# Patient Record
Sex: Male | Born: 1995 | Race: White | Hispanic: Yes | Marital: Single
Health system: Midwestern US, Community
[De-identification: ages and names within clinical notes are randomized; demographics above are authoritative.]

---

## 2016-02-27 ENCOUNTER — Emergency Department: Payer: 59

## 2016-02-27 ENCOUNTER — Encounter: Payer: Self-pay | Admitting: Emergency Medicine

## 2016-02-27 ENCOUNTER — Emergency Department
Admission: EM | Admit: 2016-02-27 | Discharge: 2016-02-27 | Disposition: A | Discharge status: Home / Self Care | Payer: 59 | Attending: Emergency Medicine | Admitting: Emergency Medicine

## 2016-02-27 DIAGNOSIS — W009XXA Unspecified fall due to ice and snow, initial encounter: Secondary | ICD-10-CM | POA: Diagnosis not present

## 2016-02-27 DIAGNOSIS — F17200 Nicotine dependence, unspecified, uncomplicated: Secondary | ICD-10-CM | POA: Diagnosis not present

## 2016-02-27 DIAGNOSIS — S99912A Unspecified injury of left ankle, initial encounter: Secondary | ICD-10-CM | POA: Diagnosis present

## 2016-02-27 DIAGNOSIS — Y929 Unspecified place or not applicable: Secondary | ICD-10-CM | POA: Diagnosis not present

## 2016-02-27 DIAGNOSIS — Y9389 Activity, other specified: Secondary | ICD-10-CM | POA: Insufficient documentation

## 2016-02-27 DIAGNOSIS — S82832A Other fracture of upper and lower end of left fibula, initial encounter for closed fracture: Secondary | ICD-10-CM | POA: Insufficient documentation

## 2016-02-27 DIAGNOSIS — Y999 Unspecified external cause status: Secondary | ICD-10-CM | POA: Insufficient documentation

## 2016-02-27 MED ORDER — HYDROCODONE-ACETAMINOPHEN 5-325 MG PO TABS
1.0000 | ORAL_TABLET | Freq: Once | ORAL | Status: AC
  Administered 2016-02-27: 1 via ORAL
  Filled 2016-02-27: qty 1

## 2016-02-27 MED ORDER — HYDROCODONE-ACETAMINOPHEN 5-325 MG PO TABS: 1.0000 | ORAL_TABLET | Freq: Three times a day (TID) | ORAL | 0 refills | Status: AC | PRN

## 2016-02-27 NOTE — ED Triage Notes (Signed)
Pt to ED by POV. Pt fell while running in the snow. Pt fell and states he heard a "loud snap".

## 2016-02-28 NOTE — ED Provider Notes (Signed)
[facility] Emergency Department Provider Note  ____________________________________________  Time seen: Approximately 8:27 AM  I have reviewed the triage vital signs and the nursing notes.   HISTORY  Chief Complaint No chief complaint on file.    HPI Brett NunneryZachary Bowers is a 21 y.o. male presenting to the emergency department with left ankle pain. Patient was playing with 2 classmates in the snow when he fell and "heard a loud snap". Patient did not hit his head or lose consciousness during the fall. Patient states that since fall, his left ankle has been edematous and he has not been able to bear weight. He denies prior traumas or surgeries to the left lower extremity. Patient denies weakness. Ambulates at baseline. 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 Patient's "sore" ankle pain is currently 4 out of 10 in intensity. He has not attempted alleviating measures.   History reviewed. No pertinent past medical history.  There are no active problems to display for this patient.   History reviewed. No pertinent surgical history.  Prior to Admission medications   Medication Sig Start Date End Date Taking? Authorizing Provider  HYDROcodone-acetaminophen (NORCO/VICODIN) 5-325 MG tablet Take 1 tablet by mouth 3 (three) times daily as needed for moderate pain. 02/27/16 03/03/16  Orvil FeilJaclyn M Woods, PA-C    Allergies Penicillins  History reviewed. No pertinent family history.  Social History Social History  Substance Use Topics  . Smoking status: Light Tobacco Smoker  . Smokeless tobacco: Never Used  . Alcohol use Yes     Review of Systems  Eyes: No visual changes.  ENT: No upper respiratory complaints. Cardiovascular: no chest pain. Respiratory: no cough. No SOB. Musculoskeletal: Patient has left ankle pain.  Skin: Patient has ecchymosis and edema of the left ankle. Neurological: Negative for headaches, focal weakness or  numbness.   ____________________________________________   PHYSICAL EXAM:  VITAL SIGNS: ED Triage Vitals  Enc Vitals Group     BP 02/27/16 1255 (!) 146/110     Pulse Rate 02/27/16 1255 92     Resp 02/27/16 1255 16     Temp 02/27/16 1255 97.6 F (36.4 C)     Temp Source 02/27/16 1255 Oral     SpO2 02/27/16 1255 100 %     Weight 02/27/16 1256 145 lb (65.8 kg)     Height 02/27/16 1256 5\' 11"  (1.803 m)     Head Circumference --      Peak Flow --      Pain Score 02/27/16 1309 7     Pain Loc --      Pain Edu? --      Excl. in [place]? --      Constitutional: Alert and oriented. Well appearing and in no acute distress.She jokes and laughs throughout exam. Cardiovascular: Normal rate, regular rhythm. Normal S1 and S2.  Good peripheral circulation. Respiratory: Normal respiratory effort without tachypnea or retractions. Lungs CTAB. Good air entry to the bases with no decreased or absent breath sounds. Musculoskeletal: To inspection, left ankle is edematous with focal ecchymosis of the skin overlying the lateral malleolus. Patient has 5 out of 5 strength in the lower extremities bilaterally. Patient is able to perform dorsiflexion and plantar flexion at the left ankle. Range of motion is moderately compromised secondary to pain. Patient is able to actively flex and extend all 5 left toes. Palpable dorsalis pedis pulse bilaterally and symmetrically. No pain was elicited with palpation of the fibular head. Patient has pain with palpation over the  lateral malleolus. Neurologic:  Normal speech and language. No gross focal neurologic deficits are appreciated. Reflexes are 2+ and symmetric in the lower extremities bilaterally. Psychiatric: Mood and affect are normal. Speech and behavior are normal. Patient exhibits appropriate insight and judgement.  ____________________________________________   LABS (all labs ordered are listed, but only abnormal results are displayed)  Labs Reviewed - No data  to display ____________________________________________  EKG   ____________________________________________  RADIOLOGY Geraldo Pitter, personally viewed and evaluated these images (plain radiographs) as part of my medical decision making, as well as reviewing the written report by the radiologist.  Dg Ankle Complete Left  Result Date: 02/27/2016 CLINICAL DATA:  Fall, pain and swelling EXAM: LEFT ANKLE COMPLETE - 3+ VIEW COMPARISON:  None. FINDINGS: Slightly displaced obliquely oriented fracture within the distal left fibula. Associated soft tissue swelling/edema over the distal fibula and lateral malleolus. Distal tibia appears intact and normally aligned. Ankle mortise is symmetric and talar dome appears intact. Visualized portions of the hindfoot and midfoot appear intact and normally aligned. IMPRESSION: Slightly displaced fracture of the distal left fibula. Associated soft tissue swelling/edema. Electronically Signed   By: Bary Richard M.D.   On: 02/27/2016 14:11    ____________________________________________    PROCEDURES  Procedure(s) performed:    Procedures    Medications  HYDROcodone-acetaminophen (NORCO/VICODIN) 5-325 MG per tablet 1 tablet (1 tablet Oral Given 02/27/16 1442)     ____________________________________________   INITIAL IMPRESSION / ASSESSMENT AND PLAN / ED COURSE  Pertinent labs & imaging results that were available during my care of the patient were reviewed by me and considered in my medical decision making (see chart for details).  Review of the [place] CSRS was performed in accordance of the [facility] prior to dispensing any controlled drugs.    Assessment and plan:  Distal fibula fracture:  Patient presents to the emergency department with left ankle pain. DG left ankle reveals a mildly displaced distal fibula fracture. Patient was placed in a stirrup splint and a referral was made to orthopedics, Dr. Hyacinth Meeker. Patient was advised to make an  appointment as soon as possible. Patient was discharged with Norco for pain. All patient questions were answered. ____________________________________________  FINAL CLINICAL IMPRESSION(S) / ED DIAGNOSES  Final diagnoses:  Other closed fracture of distal end of left fibula, initial encounter      NEW MEDICATIONS STARTED DURING THIS VISIT:  There are no discharge medications for this patient.       This chart was dictated using voice recognition software/Dragon. Despite best efforts to proofread, errors can occur which can change the meaning. Any change was purely unintentional.    Orvil Feil, PA-C 02/28/16 0840    Emily Filbert, MD 02/28/16 [phone]

## 2018-02-27 ENCOUNTER — Emergency Department
Admission: EM | Admit: 2018-02-27 | Discharge: 2018-02-27 | Disposition: A | Discharge status: Home / Self Care | Payer: 59 | Attending: Emergency Medicine | Admitting: Emergency Medicine

## 2018-02-27 ENCOUNTER — Other Ambulatory Visit: Payer: Self-pay

## 2018-02-27 ENCOUNTER — Emergency Department: Payer: 59

## 2018-02-27 ENCOUNTER — Encounter: Payer: Self-pay | Admitting: Radiology

## 2018-02-27 DIAGNOSIS — R109 Unspecified abdominal pain: Secondary | ICD-10-CM | POA: Diagnosis present

## 2018-02-27 DIAGNOSIS — Z79899 Other long term (current) drug therapy: Secondary | ICD-10-CM | POA: Diagnosis not present

## 2018-02-27 DIAGNOSIS — F17200 Nicotine dependence, unspecified, uncomplicated: Secondary | ICD-10-CM | POA: Insufficient documentation

## 2018-02-27 DIAGNOSIS — R1031 Right lower quadrant pain: Secondary | ICD-10-CM

## 2018-02-27 LAB — COMPREHENSIVE METABOLIC PANEL
ALBUMIN: 5.3 g/dL — AB (ref 3.5–5.0)
ALK PHOS: 63 U/L (ref 38–126)
ALT: 15 U/L (ref 0–44)
AST: 18 U/L (ref 15–41)
Anion gap: 8 (ref 5–15)
BILIRUBIN TOTAL: 1.5 mg/dL — AB (ref 0.3–1.2)
BUN: 14 mg/dL (ref 6–20)
CALCIUM: 9.5 mg/dL (ref 8.9–10.3)
CO2: 27 mmol/L (ref 22–32)
Chloride: 104 mmol/L (ref 98–111)
Creatinine, Ser: 1 mg/dL (ref 0.61–1.24)
GFR calc Af Amer: >60 mL/min (ref >60)
GFR calc non Af Amer: >60 mL/min (ref >60)
GLUCOSE: 119 mg/dL — AB (ref 70–99)
POTASSIUM: 3.5 mmol/L (ref 3.5–5.1)
SODIUM: 139 mmol/L (ref 135–145)
TOTAL PROTEIN: 7.9 g/dL (ref 6.5–8.1)

## 2018-02-27 LAB — CBC
HCT: 45.9 % (ref 39.0–52.0)
HEMOGLOBIN: 16 g/dL (ref 13.0–17.0)
MCH: 32.9 pg (ref 26.0–34.0)
MCHC: 34.9 g/dL (ref 30.0–36.0)
MCV: 94.4 fL (ref 80.0–100.0)
Platelets: 236 10*3/uL (ref 150–400)
RBC: 4.86 MIL/uL (ref 4.22–5.81)
RDW: 12.2 % (ref 11.5–15.5)
WBC: 5.5 10*3/uL (ref 4.0–10.5)
nRBC: 0 % (ref 0.0–0.2)

## 2018-02-27 LAB — LIPASE, BLOOD: Lipase: 27 U/L (ref 11–51)

## 2018-02-27 MED ORDER — IOHEXOL 300 MG/ML  SOLN
100.0000 mL | Freq: Once | INTRAMUSCULAR | Status: AC | PRN
  Administered 2018-02-27: 100 mL via INTRAVENOUS

## 2018-02-27 MED ORDER — IOPAMIDOL (ISOVUE-300) INJECTION 61%
30.0000 mL | Freq: Once | INTRAVENOUS | Status: AC | PRN
  Administered 2018-02-27: 30 mL via ORAL

## 2018-02-27 NOTE — ED Provider Notes (Signed)
[facility] Emergency Department Provider Note   ____________________________________________    I have reviewed the triage vital signs and the nursing notes.   HISTORY  Chief Complaint Abdominal Pain     HPI Brett Bowers is a 23 y.o. male who presents with complaints of abdominal pain.  Patient reports 3 days ago developed pain just to the right of his umbilicus, describes it as a dull ache.  Over the last 24 hours it is shifted into his right lower quadrant and become more sharp in nature.  He went to urgent care prior to arrival who referred him to the ED for concern for appendicitis.  Denies fevers or chills, reports the pain is mild to moderate at worst.  Has not taken anything for this.  No history of abdominal surgery.  Does have a history of kidney stones but many years ago.  History reviewed. No pertinent past medical history.  There are no active problems to display for this patient.   No past surgical history on file.  Prior to Admission medications   Medication Sig Start Date End Date Taking? Authorizing Provider  amphetamine-dextroamphetamine (ADDERALL XR) 10 MG 24 hr capsule Take 10 mg by mouth daily.   Yes [provider]     Allergies Penicillins  No family history on file.  Social History Social History   Tobacco Use  . Smoking status: Light Tobacco Smoker  . Smokeless tobacco: Never Used  Substance Use Topics  . Alcohol use: Yes  . Drug use: Yes    Types: Marijuana    Review of Systems  Constitutional: No fever/chills Eyes: No visual changes.  ENT: No sore throat. Cardiovascular: Denies chest pain. Respiratory: Denies shortness of breath. Gastrointestinal: As above Genitourinary: Negative for dysuria.  No hematuria Musculoskeletal: Negative for back pain. Skin: Negative for rash. Neurological: Negative for headaches or weakness   ____________________________________________   PHYSICAL  EXAM:  VITAL SIGNS: ED Triage Vitals [02/27/18 1701]  Enc Vitals Group     BP 133/80     Pulse Rate 75     Resp      Temp 98.4 F (36.9 C)     Temp Source Oral     SpO2 100 %     Weight 68.5 kg (151 lb)     Height 1.803 m (5\' 11" )     Head Circumference      Peak Flow      Pain Score      Pain Loc      Pain Edu?      Excl. in [place]?     Constitutional: Alert and oriented.  Eyes: Conjunctivae are normal.   Nose: No congestion/rhinnorhea. Mouth/Throat: Mucous membranes are moist.    Cardiovascular: Normal rate, regular rhythm. Grossly normal heart sounds.  Good peripheral circulation. Respiratory: Normal respiratory effort.  No retractions. Lungs CTAB. GI: Minimal tenderness at McBurney's point, nonsurgical abdomen, no distention Musculoskeletal:  Warm and well perfused Neurologic:  Normal speech and language. No gross focal neurologic deficits are appreciated.  Skin:  Skin is warm, dry and intact. No rash noted. Psychiatric: Mood and affect are normal. Speech and behavior are normal.  ____________________________________________   LABS (all labs ordered are listed, but only abnormal results are displayed)  Labs Reviewed  COMPREHENSIVE METABOLIC PANEL - Abnormal; Notable for the following components:      Result Value   Glucose, Bld 119 (*)    Albumin 5.3 (*)    Total Bilirubin 1.5 (*)  All other components within normal limits  LIPASE, BLOOD  CBC  URINALYSIS, COMPLETE (UACMP) WITH MICROSCOPIC   ____________________________________________  EKG  None ____________________________________________  RADIOLOGY  CT abdomen pelvis ____________________________________________   PROCEDURES  Procedure(s) performed: No  Procedures   Critical Care performed: No ____________________________________________   INITIAL IMPRESSION / ASSESSMENT AND PLAN / ED COURSE  Pertinent labs & imaging results that were available during my care of the patient were  reviewed by me and considered in my medical decision making (see chart for details).  Patient presents with right lower quadrant abdominal pain.  Minimal tenderness on exam however history is concerning for appendicitis.  Lab work is unremarkable, we will obtain CT abdomen pelvis reevaluate  ----------------------------------------- 7:31 PM on 02/27/2018 -----------------------------------------  CT abdomen pelvis is negative for appendicitis, no abnormalities noted.  Appropriate for discharge at this time with strict return precautions of any worsening pain.  Outpatient follow-up with PCP.    ____________________________________________   FINAL CLINICAL IMPRESSION(S) / ED DIAGNOSES  Final diagnoses:  Right lower quadrant abdominal pain        Note:  This document was prepared using Dragon voice recognition software and may include unintentional dictation errors.   Jene Every, MD 02/27/18 [phone]

## 2018-02-27 NOTE — ED Triage Notes (Signed)
Pt arrived via POV from urgent care, pain to RLQ started 3 days ago, has moved from umbilical area to the RLQ.    Pt denies N/V or fevers, pt states he has had loss of appetite.  Pt reports eating chick-fil-a just prior to arrival.

## 2018-02-27 NOTE — ED Provider Notes (Signed)
Formatting of this note is different from the original.  Images from the original note were not included.      [facility]  Emergency Department Provider Note    ____________________________________________    I have reviewed the triage vital signs and the nursing notes.    HISTORY    Chief Complaint  Abdominal Pain    HPI  Kenneth Guzman is a 23 y.o. male who presents with complaints of abdominal pain.  Patient reports 3 days ago developed pain just to the right of his umbilicus, describes it as a dull ache.  Over the last 24 hours it is shifted into his right lower quadrant and become more sharp in nature.  He went to urgent care prior to arrival who referred him to the ED for concern for appendicitis.  Denies fevers or chills, reports the pain is mild to moderate at worst.  Has not taken anything for this.  No history of abdominal surgery.  Does have a history of kidney stones but many years ago.    History reviewed. No pertinent past medical history.    There are no active problems to display for this patient.    No past surgical history on file.    Prior to Admission medications    Medication Sig Start Date End Date Taking? Authorizing Provider   amphetamine-dextroamphetamine (ADDERALL XR) 10 MG 24 hr capsule Take 10 mg by mouth daily.   Yes [provider]       Allergies  Penicillins    No family history on file.    Social History  Social History     Tobacco Use   ? Smoking status: Light Tobacco Smoker   ? Smokeless tobacco: Never Used   Substance Use Topics   ? Alcohol use: Yes   ? Drug use: Yes     Types: Marijuana     Review of Systems    Constitutional: No fever/chills  Eyes: No visual changes.   ENT: No sore throat.  Cardiovascular: Denies chest pain.  Respiratory: Denies shortness of breath.  Gastrointestinal: As above  Genitourinary: Negative for dysuria.  No hematuria  Musculoskeletal: Negative for back pain.  Skin: Negative for rash.  Neurological: Negative for headaches  or weakness    ____________________________________________    PHYSICAL EXAM:    VITAL SIGNS:  ED Triage Vitals [02/27/18 1701]   Enc Vitals Group      BP 133/80      Pulse Rate 75      Resp       Temp 98.4 F (36.9 C)      Temp Source Oral      SpO2 100 %      Weight 68.5 kg (151 lb)      Height 1.803 m (5\' 11" )      Head Circumference       Peak Flow       Pain Score       Pain Loc       Pain Edu?       Excl. in [place]?      Constitutional: Alert and oriented.   Eyes: Conjunctivae are normal.     Nose: No congestion/rhinnorhea.  Mouth/Throat: Mucous membranes are moist.      Cardiovascular: Normal rate, regular rhythm. Grossly normal heart sounds.  Good peripheral circulation.  Respiratory: Normal respiratory effort.  No retractions. Lungs CTAB.  GI: Minimal tenderness at McBurney's point, nonsurgical abdomen, no distention  Musculoskeletal:  Warm  and well perfused  Neurologic:  Normal speech and language. No gross focal neurologic deficits are appreciated.   Skin:  Skin is warm, dry and intact. No rash noted.  Psychiatric: Mood and affect are normal. Speech and behavior are normal.    ____________________________________________    LABS  (all labs ordered are listed, but only abnormal results are displayed)    Labs Reviewed   COMPREHENSIVE METABOLIC PANEL - Abnormal; Notable for the following components:       Result Value    Glucose, Bld 119 (*)     Albumin 5.3 (*)     Total Bilirubin 1.5 (*)     All other components within normal limits   LIPASE, BLOOD   CBC   URINALYSIS, COMPLETE (UACMP) WITH MICROSCOPIC     ____________________________________________    EKG    None  ____________________________________________    RADIOLOGY    CT abdomen pelvis  ____________________________________________    PROCEDURES    Procedure(s) performed: No    Procedures    Critical Care performed: No  ____________________________________________    INITIAL IMPRESSION / ASSESSMENT AND PLAN / ED COURSE    Pertinent labs & imaging results  that were available during my care of the patient were reviewed by me and considered in my medical decision making (see chart for details).    Patient presents with right lower quadrant abdominal pain.  Minimal tenderness on exam however history is concerning for appendicitis.  Lab work is unremarkable, we will obtain CT abdomen pelvis reevaluate    -----------------------------------------  7:31 PM on 02/27/2018  -----------------------------------------    CT abdomen pelvis is negative for appendicitis, no abnormalities noted.  Appropriate for discharge at this time with strict return precautions of any worsening pain.  Outpatient follow-up with PCP.      ____________________________________________    FINAL CLINICAL IMPRESSION(S) / ED DIAGNOSES    Final diagnoses:   Right lower quadrant abdominal pain     Note:  This document was prepared using Dragon voice recognition software and may include unintentional dictation errors.    Lavonia Drafts, MD  02/27/18 1932    Electronically signed by Lavonia Drafts, MD at 02/27/2018  7:32 PM EST

## 2018-02-27 NOTE — ED Triage Notes (Signed)
Formatting of this note might be different from the original.  Pt arrived via POV from urgent care, pain to RLQ started 3 days ago, has moved from umbilical area to the RLQ.      Pt denies N/V or fevers, pt states he has had loss of appetite.    Pt reports eating chick-fil-a just prior to arrival.   Electronically signed by Ulyses Amor, RN at 02/27/2018  5:05 PM EST

## 2018-12-30 ENCOUNTER — Emergency Department: Admit: 2018-12-30 | Payer: PRIVATE HEALTH INSURANCE

## 2018-12-30 ENCOUNTER — Inpatient Hospital Stay
Admit: 2018-12-30 | Discharge: 2018-12-30 | Disposition: A | Discharge status: Home / Self Care | Payer: PRIVATE HEALTH INSURANCE | Attending: Student in an Organized Health Care Education/Training Program

## 2018-12-30 DIAGNOSIS — N200 Calculus of kidney: Secondary | ICD-10-CM

## 2018-12-30 LAB — URINALYSIS W/MICROSCOPIC
Bacteria: NEGATIVE /hpf
Glucose: NEGATIVE mg/dL
Ketone: NEGATIVE mg/dL
Nitrites: POSITIVE — AB
Protein: 100 mg/dL — AB
RBC: >100 /hpf — ABNORMAL HIGH (ref 0–5)
Specific gravity: 1.028 (ref 1.003–1.030)
Urobilinogen: 0.2 EU/dL (ref 0.2–1.0)
pH (UA): 8 (ref 5.0–8.0)

## 2018-12-30 LAB — CBC WITH AUTOMATED DIFF
ABS. BASOPHILS: 0 10*3/uL (ref 0.0–0.1)
ABS. EOSINOPHILS: 0.1 10*3/uL (ref 0.0–0.4)
ABS. IMM. GRANS.: 0 10*3/uL (ref 0.00–0.04)
ABS. LYMPHOCYTES: 1.4 10*3/uL (ref 0.8–3.5)
ABS. MONOCYTES: 0.3 10*3/uL (ref 0.0–1.0)
ABS. NEUTROPHILS: 4.8 10*3/uL (ref 1.8–8.0)
ABSOLUTE NRBC: 0 10*3/uL (ref 0.00–0.01)
BASOPHILS: 0 % (ref 0–1)
EOSINOPHILS: 1 % (ref 0–7)
HCT: 46.4 % (ref 36.6–50.3)
HGB: 16.3 g/dL (ref 12.1–17.0)
IMMATURE GRANULOCYTES: 0 % (ref 0.0–0.5)
LYMPHOCYTES: 22 % (ref 12–49)
MCH: 33.4 PG (ref 26.0–34.0)
MCHC: 35.1 g/dL (ref 30.0–36.5)
MCV: 95.1 FL (ref 80.0–99.0)
MONOCYTES: 4 % — ABNORMAL LOW (ref 5–13)
MPV: 9.6 FL (ref 8.9–12.9)
NEUTROPHILS: 73 % (ref 32–75)
NRBC: 0 PER 100 WBC
PLATELET: 230 10*3/uL (ref 150–400)
RBC: 4.88 M/uL (ref 4.10–5.70)
RDW: 11.9 % (ref 11.5–14.5)
WBC: 6.6 10*3/uL (ref 4.1–11.1)

## 2018-12-30 LAB — METABOLIC PANEL, BASIC
Anion gap: 7 mmol/L (ref 5–15)
BUN/Creatinine ratio: 11 — ABNORMAL LOW (ref 12–20)
BUN: 14 MG/DL (ref 6–20)
CO2: 27 mmol/L (ref 21–32)
Calcium: 9.2 MG/DL (ref 8.5–10.1)
Chloride: 104 mmol/L (ref 97–108)
Creatinine: 1.33 MG/DL — ABNORMAL HIGH (ref 0.70–1.30)
GFR est AA: >60 mL/min/{1.73_m2} (ref >60)
GFR est non-AA: >60 mL/min/{1.73_m2} (ref >60)
Glucose: 102 mg/dL — ABNORMAL HIGH (ref 65–100)
Potassium: 4.5 mmol/L (ref 3.5–5.1)
Sodium: 138 mmol/L (ref 136–145)

## 2018-12-30 LAB — URINE CULTURE HOLD SAMPLE

## 2018-12-30 LAB — SAMPLES BEING HELD

## 2018-12-30 LAB — BILIRUBIN, CONFIRM: Bilirubin UA, confirm: NEGATIVE

## 2018-12-30 LAB — CBC WITH AUTO DIFFERENTIAL
Basophils %: 0 % (ref 0–1)
Basophils Absolute: 0 10*3/uL (ref 0.0–0.1)
Eosinophils %: 1 % (ref 0–7)
Eosinophils Absolute: 0.1 10*3/uL (ref 0.0–0.4)
Granulocyte Absolute Count: 0 10*3/uL (ref 0.00–0.04)
Hematocrit: 46.4 % (ref 36.6–50.3)
Hemoglobin: 16.3 g/dL (ref 12.1–17.0)
Immature Granulocytes: 0 % (ref 0.0–0.5)
Lymphocytes %: 22 % (ref 12–49)
Lymphocytes Absolute: 1.4 10*3/uL (ref 0.8–3.5)
MCH: 33.4 PG (ref 26.0–34.0)
MCHC: 35.1 g/dL (ref 30.0–36.5)
MCV: 95.1 FL (ref 80.0–99.0)
MPV: 9.6 FL (ref 8.9–12.9)
Monocytes %: 4 % — ABNORMAL LOW (ref 5–13)
Monocytes Absolute: 0.3 10*3/uL (ref 0.0–1.0)
NRBC Absolute: 0 10*3/uL (ref 0.00–0.01)
Neutrophils %: 73 % (ref 32–75)
Neutrophils Absolute: 4.8 10*3/uL (ref 1.8–8.0)
Nucleated RBCs: 0 PER 100 WBC
Platelets: 230 10*3/uL (ref 150–400)
RBC: 4.88 M/uL (ref 4.10–5.70)
RDW: 11.9 % (ref 11.5–14.5)
WBC: 6.6 10*3/uL (ref 4.1–11.1)

## 2018-12-30 LAB — URINALYSIS WITH MICROSCOPIC
BACTERIA, URINE: NEGATIVE /hpf
Glucose, Ur: NEGATIVE mg/dL
Ketones, Urine: NEGATIVE mg/dL
Nitrite, Urine: POSITIVE — AB
Protein, UA: 100 mg/dL — AB
RBC, UA: >100 /hpf — ABNORMAL HIGH (ref 0–5)
Specific Gravity, UA: 1.028 (ref 1.003–1.030)
Urobilinogen, UA, POCT: 0.2 EU/dL (ref 0.2–1.0)
pH, UA: 8 (ref 5.0–8.0)

## 2018-12-30 LAB — BASIC METABOLIC PANEL
Anion Gap: 7 mmol/L (ref 5–15)
BUN: 14 MG/DL (ref 6–20)
Bun/Cre Ratio: 11 — ABNORMAL LOW (ref 12–20)
CO2: 27 mmol/L (ref 21–32)
Calcium: 9.2 MG/DL (ref 8.5–10.1)
Chloride: 104 mmol/L (ref 97–108)
Creatinine: 1.33 MG/DL — ABNORMAL HIGH (ref 0.70–1.30)
EGFR IF NonAfrican American: >60 mL/min/{1.73_m2} (ref >60)
GFR African American: >60 mL/min/{1.73_m2} (ref >60)
Glucose: 102 mg/dL — ABNORMAL HIGH (ref 65–100)
Potassium: 4.5 mmol/L (ref 3.5–5.1)
Sodium: 138 mmol/L (ref 136–145)

## 2018-12-30 LAB — BILIRUBIN, CONFIRMATORY: Bilirubin Urine: NEGATIVE

## 2018-12-30 MED ORDER — KETOROLAC TROMETHAMINE 30 MG/ML INJECTION
30 mg/mL (1 mL) | INTRAMUSCULAR | Status: AC
Start: 2018-12-30 — End: 2018-12-30
  Administered 2018-12-30: 14:00:00 via INTRAVENOUS

## 2018-12-30 MED ORDER — TAMSULOSIN SR 0.4 MG 24 HR CAP
0.4 mg | ORAL_CAPSULE | Freq: Every day | ORAL | 0 refills | Status: AC
Start: 2018-12-30 — End: 2019-01-02

## 2018-12-30 MED ORDER — SODIUM CHLORIDE 0.9 % IV
Freq: Once | INTRAVENOUS | Status: AC
Start: 2018-12-30 — End: 2018-12-30
  Administered 2018-12-30: 14:00:00 via INTRAVENOUS

## 2018-12-30 MED ORDER — ONDANSETRON 4 MG TAB, RAPID DISSOLVE
4 mg | ORAL_TABLET | Freq: Three times a day (TID) | ORAL | 0 refills | Status: AC | PRN
Start: 2018-12-30 — End: 2019-01-03

## 2018-12-30 MED ORDER — KETOROLAC TROMETHAMINE 10 MG TAB
10 mg | ORAL_TABLET | Freq: Four times a day (QID) | ORAL | 0 refills | Status: AC | PRN
Start: 2018-12-30 — End: 2019-01-01

## 2018-12-30 MED FILL — KETOROLAC TROMETHAMINE 30 MG/ML INJECTION: 30 mg/mL (1 mL) | INTRAMUSCULAR | Qty: 1

## 2018-12-30 MED FILL — SODIUM CHLORIDE 0.9 % IV: INTRAVENOUS | Qty: 1000

## 2018-12-30 NOTE — ED Notes (Signed)
Pt resting on stretcher.  IV fluids completed.  Pt reports no pain at this time.  Pt states the pain comes in waves but only last a short time and then goes away.  Abdomen soft, non tender to palpation.  No redness or swelling noted to left testicle.  Pt aware of plan of care, awaiting radiologist to read ultrasound report.  No needs at this time.  Call bell within reach.

## 2018-12-30 NOTE — ED Provider Notes (Signed)
Patient is a 23 year old male who is relatively healthy presenting today with left flank and testicle pain.  At 6:30 AM this morning he woke up with left-sided testicular pain.  Patient states that the pain then migrated to his left flank and was severe, radiating to the left groin.  States that the pain undulates in severity, can be a 1/10 to as high as an 8/10.  Right now he states that he also has pain in his penis.  Testicular pain seems to be improving.  Patient thinks he is having a kidney stone, had several when he was in high school.  Also reports history of hydrocele on the left side which was diagnosed after having months of left testicular pain over the summer.  No hematuria.  No fevers or chills.  No abdominal pain.  No vomiting or diarrhea.  No other acute complaints at this time.           History reviewed. No pertinent past medical history.    History reviewed. No pertinent surgical history.      History reviewed. No pertinent family history.    Social History     Socioeconomic History   ??? Marital status: Not on file     Spouse name: Not on file   ??? Number of children: Not on file   ??? Years of education: Not on file   ??? Highest education level: Not on file   Occupational History   ??? Not on file   Social Needs   ??? Financial resource strain: Not on file   ??? Food insecurity     Worry: Not on file     Inability: Not on file   ??? Transportation needs     Medical: Not on file     Non-medical: Not on file   Tobacco Use   ??? Smoking status: Never Smoker   ??? Smokeless tobacco: Never Used   Substance and Sexual Activity   ??? Alcohol use: Yes   ??? Drug use: Yes     Types: Marijuana   ??? Sexual activity: Yes     Partners: Female     Birth control/protection: Condom   Lifestyle   ??? Physical activity     Days per week: Not on file     Minutes per session: Not on file   ??? Stress: Not on file   Relationships   ??? Social Product manager on phone: Not on file     Gets together: Not on file      Attends religious service: Not on file     Active member of club or organization: Not on file     Attends meetings of clubs or organizations: Not on file     Relationship status: Not on file   ??? Intimate partner violence     Fear of current or ex partner: Not on file     Emotionally abused: Not on file     Physically abused: Not on file     Forced sexual activity: Not on file   Other Topics Concern   ??? Not on file   Social History Narrative   ??? Not on file         ALLERGIES: Pcn [penicillins]    Review of Systems   Constitutional: Negative for chills and fever.   HENT: Negative for congestion and sore throat.    Eyes: Negative for pain and redness.   Respiratory: Negative for cough and shortness of  breath.    Cardiovascular: Negative for chest pain and palpitations.   Gastrointestinal: Negative for abdominal pain, diarrhea, nausea and vomiting.   Genitourinary: Positive for flank pain and testicular pain. Negative for frequency and hematuria.   Musculoskeletal: Negative for back pain and neck pain.   Skin: Negative for rash and wound.   Neurological: Negative for dizziness and headaches.   Hematological: Does not bruise/bleed easily.       Vitals:    12/30/18 0820   BP: (!) 144/69   Pulse: 69   Resp: 17   Temp: 97.6 ??F (36.4 ??C)   SpO2: 99%            Physical Exam  Vitals signs and nursing note reviewed.   Constitutional:       General: He is not in acute distress.     Appearance: He is well-developed.   HENT:      Head: Normocephalic and atraumatic.   Eyes:      Conjunctiva/sclera: Conjunctivae normal.      Pupils: Pupils are equal, round, and reactive to light.   Neck:      Musculoskeletal: Normal range of motion and neck supple.   Cardiovascular:      Rate and Rhythm: Normal rate and regular rhythm.      Heart sounds: Normal heart sounds. No murmur. No friction rub. No gallop.    Pulmonary:      Effort: Pulmonary effort is normal. No respiratory distress.       Breath sounds: Normal breath sounds. No wheezing or rales.   Abdominal:      General: Bowel sounds are normal. There is no distension.      Palpations: Abdomen is soft.      Tenderness: There is no abdominal tenderness. There is no guarding or rebound.   Genitourinary:     Penis: Normal.       Scrotum/Testes: Normal.      Comments: +cremasteric reflex b/l  No palpable mass  No exam findings c/w torsion  Musculoskeletal: Normal range of motion.   Skin:     General: Skin is warm and dry.      Capillary Refill: Capillary refill takes less than 2 seconds.      Findings: No rash.   Neurological:      Mental Status: He is alert and oriented to person, place, and time.        Labs Reviewed:   No leukocytosis  No anemia  UA with LE/Nit/Blood; NO WBC, no bacteria--unlikely to be infected  Creat slightly elevated at 1.33      Imaging Reviewed:   Renal US: No hydronephrosis or acute process  Scrotal US: Small left hydrocele        Course:  Toradol IV given +IVF with improvement in symptoms    10:09 AM re-evaluated. Feeling better.     MDM:  Patient is a 23 year old male here with flank and testicular pain left side.  Reminiscent of prior kidney stone.  Urine with large amount of blood which is indicative of kidney stone.  No significant hydronephrosis seen on ultrasound.  Also had some testicular pain so ultrasound was ordered of the testicle but there is no evidence of torsion or other acute process except for small hydrocele.  Urine does have nitrates and small leukoesterase however no bacteria or white blood cells therefore do not feel that this is an infected stone.  He was given a dose of Toradol IV fluids here with significant improvement.  Patient was sent home with Toradol (advised to not take any other NSAIDs), Flomax and Zofran.  He is to follow-up with urology.  He will return here if worsening of symptoms.            Clinical Impression:     ICD-10-CM ICD-9-CM    1. Kidney stone  N20.0 592.0     2. Left testicular pain  N50.812 608.9            Disposition: DC  Enslie Sahota E Jaylee Lantry, DO

## 2018-12-30 NOTE — ED Notes (Addendum)
Pt discharged home. Pt acting age appropriately, respirations regular and unlabored, cap refill less than two seconds. Skin pink, dry and warm. Lungs clear bilaterally. No further complaints at this time. Patient verbalized understanding of discharge paperwork and has no further questions at this time.    Education provided about continuation of care, follow up care and medication administration. Patient able to provide teach back about discharge instructions.

## 2018-12-30 NOTE — ED Triage Notes (Signed)
Pt reports left testicular pain that started this am.  Pt states the pain then radiated to his left flank.  Pt states the pain feels like the pain that he had when he passed a kidney stone.

## 2018-12-30 NOTE — ED Notes (Signed)
Pt reports left testicular pain that started this am.  Pt states the pain then radiated to his left flank.  Pt states the pain feels like the pain that he had when he passed a kidney stone.

## 2018-12-30 NOTE — ED Notes (Signed)
Pt discharged home. Pt acting age appropriately, respirations regular and unlabored, cap refill less than two seconds. Skin pink, dry and warm. Lungs clear bilaterally. No further complaints at this time.  Patientverbalized understanding of discharge paperwork and has no further questions at this time.    Education provided about continuation of care, follow up care and medication administration. Patient able to provide teach back about discharge instructions.

## 2018-12-30 NOTE — ED Notes (Signed)
Pt resting on stretcher.  IV fluids completed.  Pt reports no pain at this time.  Pt states the pain comes in waves but only last a short time and then goes away.  Abdomen soft, non tender to palpation.  No redness or swelling noted to left testicle.  Pt aware of plan of care, awaiting radiologist to read ultrasound report.  No needs at this time.  Call bell within reach.

## 2018-12-30 NOTE — ED Provider Notes (Signed)
Patient is a 23 year old male who is relatively healthy presenting today with left flank and testicle pain.  At 6:30 AM this morning he woke up with left-sided testicular pain.  Patient states that the pain then migrated to his left flank and was severe, radiating to the left groin.  States that the pain undulates in severity, can be a 1/10 to as high as an 8/10.  Right now he states that he also has pain in his penis.  Testicular pain seems to be improving.  Patient thinks he is having a kidney stone, had several when he was in high school.  Also reports history of hydrocele on the left side which was diagnosed after having months of left testicular pain over the summer.  No hematuria.  No fevers or chills.  No abdominal pain.  No vomiting or diarrhea.  No other acute complaints at this time.           History reviewed. No pertinent past medical history.    History reviewed. No pertinent surgical history.      History reviewed. No pertinent family history.    Social History     Socioeconomic History   ??? Marital status: Not on file     Spouse name: Not on file   ??? Number of children: Not on file   ??? Years of education: Not on file   ??? Highest education level: Not on file   Occupational History   ??? Not on file   Social Needs   ??? Financial resource strain: Not on file   ??? Food insecurity     Worry: Not on file     Inability: Not on file   ??? Transportation needs     Medical: Not on file     Non-medical: Not on file   Tobacco Use   ??? Smoking status: Never Smoker   ??? Smokeless tobacco: Never Used   Substance and Sexual Activity   ??? Alcohol use: Yes   ??? Drug use: Yes     Types: Marijuana   ??? Sexual activity: Yes     Partners: Female     Birth control/protection: Condom   Lifestyle   ??? Physical activity     Days per week: Not on file     Minutes per session: Not on file   ??? Stress: Not on file   Relationships   ??? Social Wellsite geologist on phone: Not on file     Gets together: Not on file     Attends religious  service: Not on file     Active member of club or organization: Not on file     Attends meetings of clubs or organizations: Not on file     Relationship status: Not on file   ??? Intimate partner violence     Fear of current or ex partner: Not on file     Emotionally abused: Not on file     Physically abused: Not on file     Forced sexual activity: Not on file   Other Topics Concern   ??? Not on file   Social History Narrative   ??? Not on file         ALLERGIES: Pcn [penicillins]    Review of Systems   Constitutional: Negative for chills and fever.   HENT: Negative for congestion and sore throat.    Eyes: Negative for pain and redness.   Respiratory: Negative for cough and shortness of  breath.    Cardiovascular: Negative for chest pain and palpitations.   Gastrointestinal: Negative for abdominal pain, diarrhea, nausea and vomiting.   Genitourinary: Positive for flank pain and testicular pain. Negative for frequency and hematuria.   Musculoskeletal: Negative for back pain and neck pain.   Skin: Negative for rash and wound.   Neurological: Negative for dizziness and headaches.   Hematological: Does not bruise/bleed easily.       Vitals:    12/30/18 0820   BP: (!) 144/69   Pulse: 69   Resp: 17   Temp: 97.6 ??F (36.4 ??C)   SpO2: 99%            Physical Exam  Vitals signs and nursing note reviewed.   Constitutional:       General: He is not in acute distress.     Appearance: He is well-developed.   HENT:      Head: Normocephalic and atraumatic.   Eyes:      Conjunctiva/sclera: Conjunctivae normal.      Pupils: Pupils are equal, round, and reactive to light.   Neck:      Musculoskeletal: Normal range of motion and neck supple.   Cardiovascular:      Rate and Rhythm: Normal rate and regular rhythm.      Heart sounds: Normal heart sounds. No murmur. No friction rub. No gallop.    Pulmonary:      Effort: Pulmonary effort is normal. No respiratory distress.      Breath sounds: Normal breath sounds. No wheezing or rales.    Abdominal:      General: Bowel sounds are normal. There is no distension.      Palpations: Abdomen is soft.      Tenderness: There is no abdominal tenderness. There is no guarding or rebound.   Genitourinary:     Penis: Normal.       Scrotum/Testes: Normal.      Comments: +cremasteric reflex b/l  No palpable mass  No exam findings c/w torsion  Musculoskeletal: Normal range of motion.   Skin:     General: Skin is warm and dry.      Capillary Refill: Capillary refill takes less than 2 seconds.      Findings: No rash.   Neurological:      Mental Status: He is alert and oriented to person, place, and time.        Labs Reviewed:   No leukocytosis  No anemia  UA with LE/Nit/Blood; NO WBC, no bacteria--unlikely to be infected  Creat slightly elevated at 1.33      Imaging Reviewed:   Renal US: No hydronephrosis or acute process  Scrotal US: Small left hydrocele        Course:  Toradol IV given +IVF with improvement in symptoms    10:09 AM re-evaluated. Feeling better.     MDM:  Patient is a 23 year old male here with flank and testicular pain left side.  Reminiscent of prior kidney stone.  Urine with large amount of blood which is indicative of kidney stone.  No significant hydronephrosis seen on ultrasound.  Also had some testicular pain so ultrasound was ordered of the testicle but there is no evidence of torsion or other acute process except for small hydrocele.  Urine does have nitrates and small leukoesterase however no bacteria or white blood cells therefore do not feel that this is an infected stone.  He was given a dose of Toradol IV fluids here with significant improvement.  Patient was sent home with Toradol (advised to not take any other NSAIDs), Flomax and Zofran.  He is to follow-up with urology.  He will return here if worsening of symptoms.            Clinical Impression:     ICD-10-CM ICD-9-CM    1. Kidney stone  N20.0 592.0    2. Left testicular pain  N50.812 608.9            Disposition: DC  Josuel Koeppen E  Valdemar Mcclenahan, DO

## 2019-12-11 IMAGING — CT CT ABD-PELV W/ CM
2 of 4 series · 16 of 46 positions shown, 18 images · IV contrast (APPLIED)
Comparison: None.

CLINICAL DATA: Right lower quadrant pain for 3 days.

EXAM:
CT ABDOMEN AND PELVIS WITH CONTRAST
TECHNIQUE: Multidetector CT imaging of the abdomen and pelvis was performed
using the standard protocol following bolus administration of
intravenous contrast.
CONTRAST:  100mL OMNIPAQUE IOHEXOL 300 MG/ML SOLN, 30mL [id number]
IOPAMIDOL (2BX39O-B33) INJECTION 61%

[Series 2: routine abd/pel with · axial · 0.62mm/px · z∈[-1089,-684]mm · 13 of 89 slices shown, 15 images]
[im 4/89  soft-tissue]
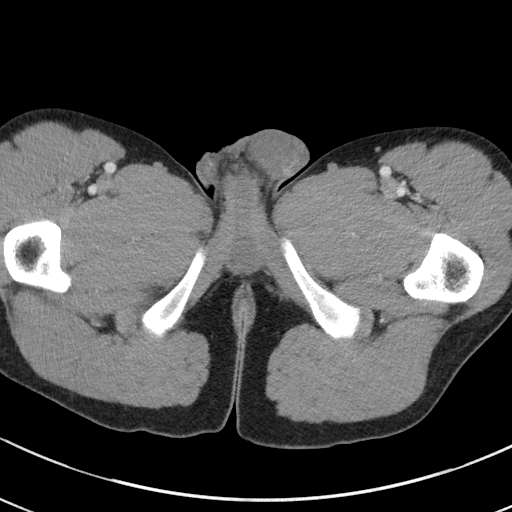
[im 4/89  bone]
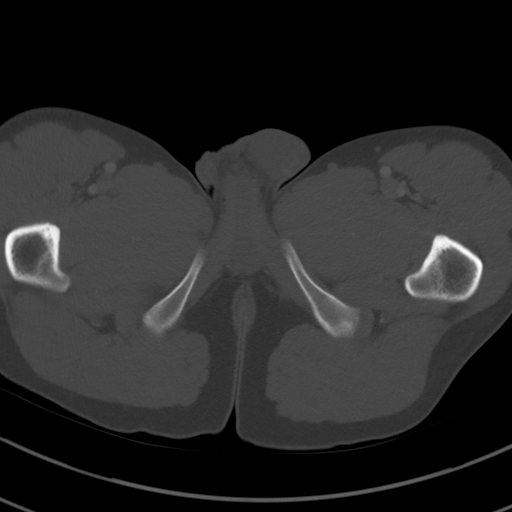
[im 11/89  soft-tissue]
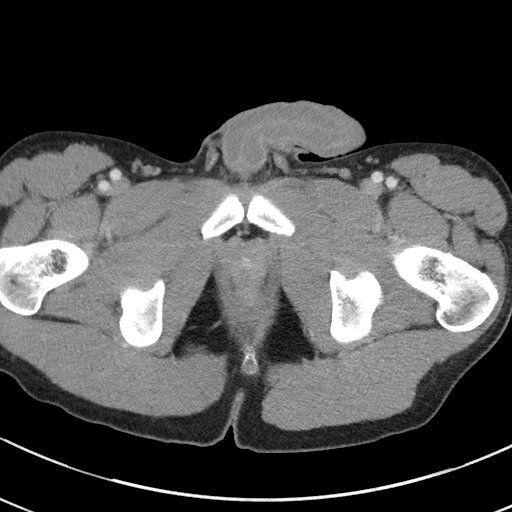
[im 18/89  soft-tissue]
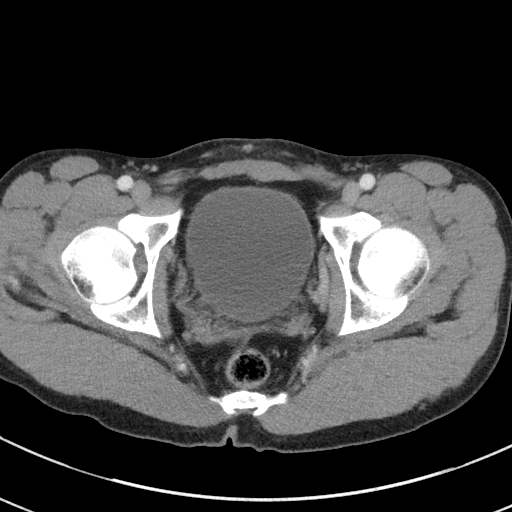
[im 25/89  soft-tissue]
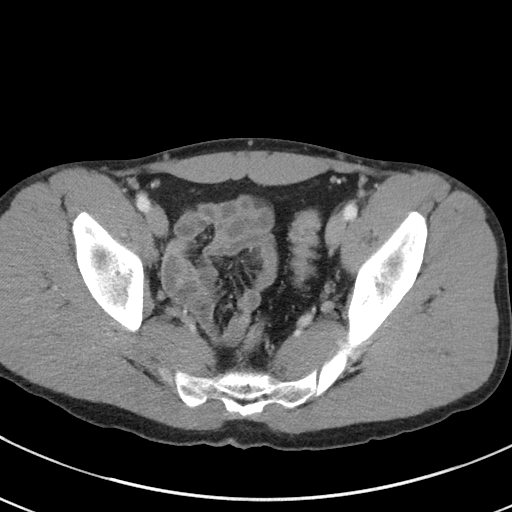
[im 32/89  soft-tissue]
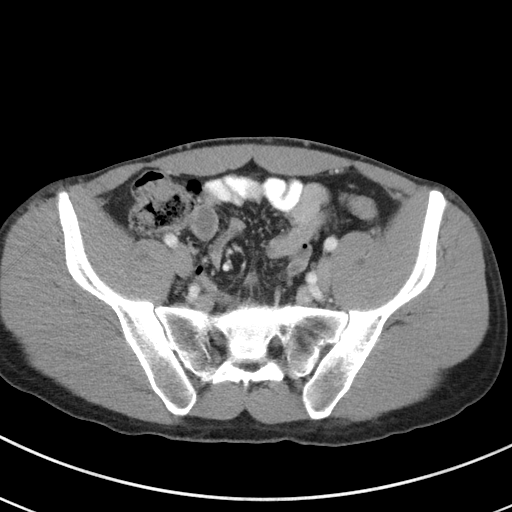
[im 39/89  soft-tissue]
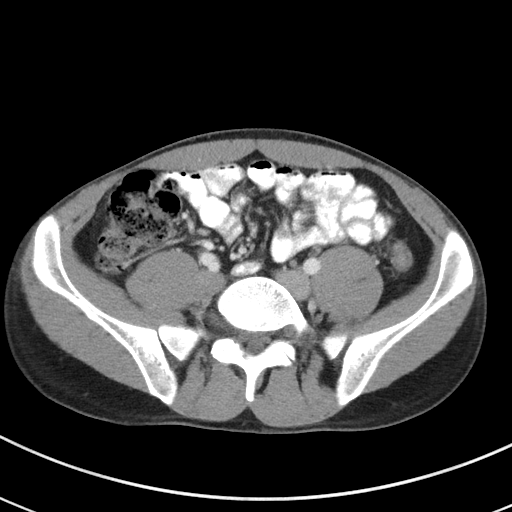
[im 46/89  soft-tissue]
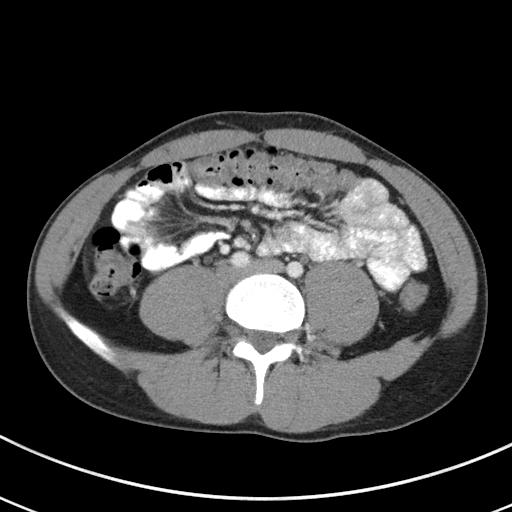
[im 50/89  soft-tissue]
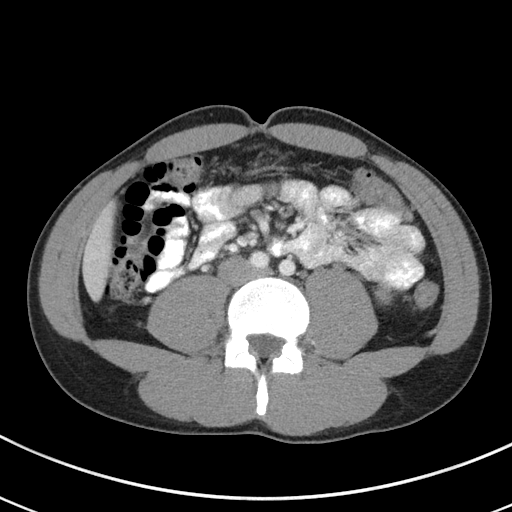
[im 57/89  soft-tissue]
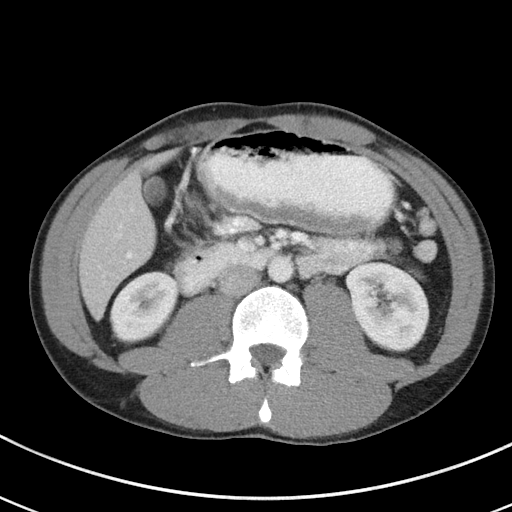
[im 57/89  bone]
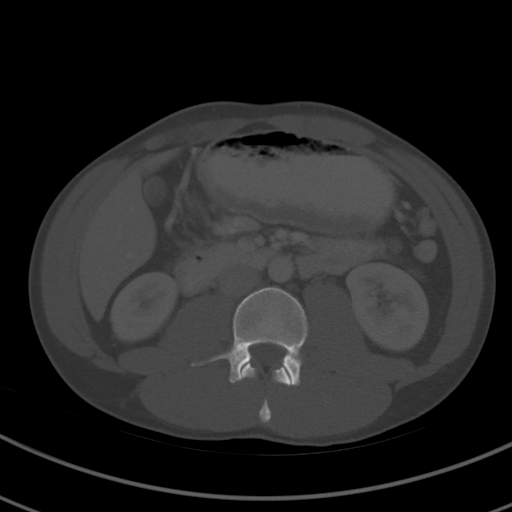
[im 64/89  soft-tissue]
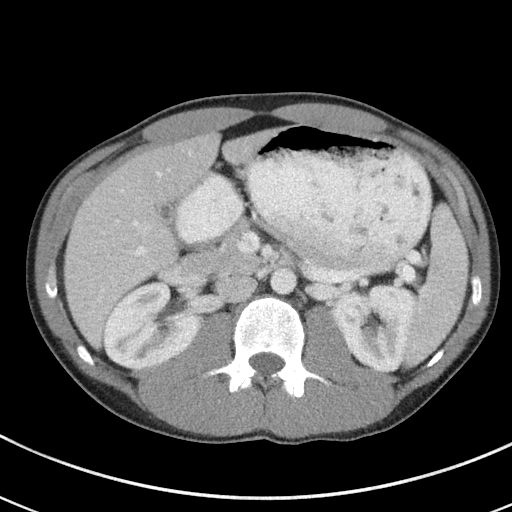
[im 71/89  soft-tissue]
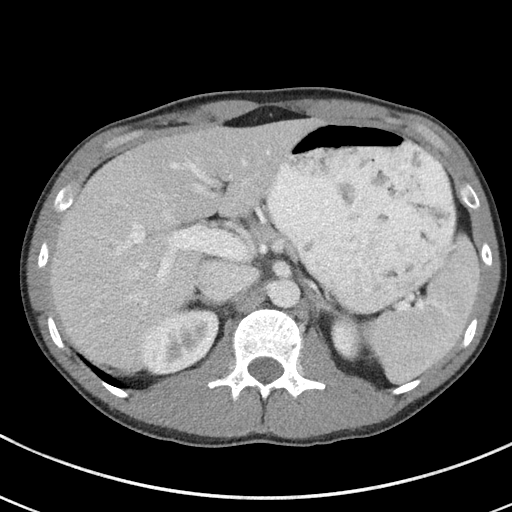
[im 78/89  soft-tissue]
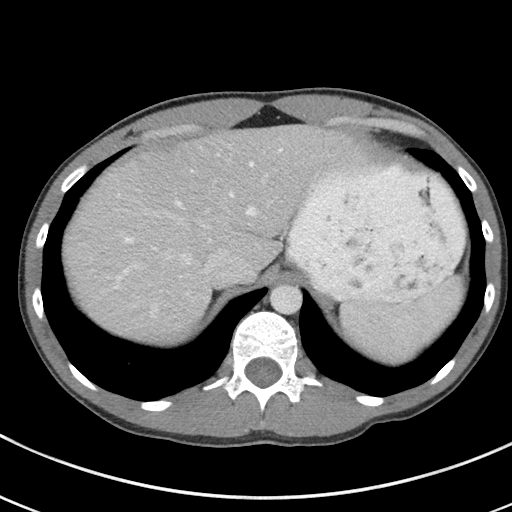
[im 85/89  soft-tissue]
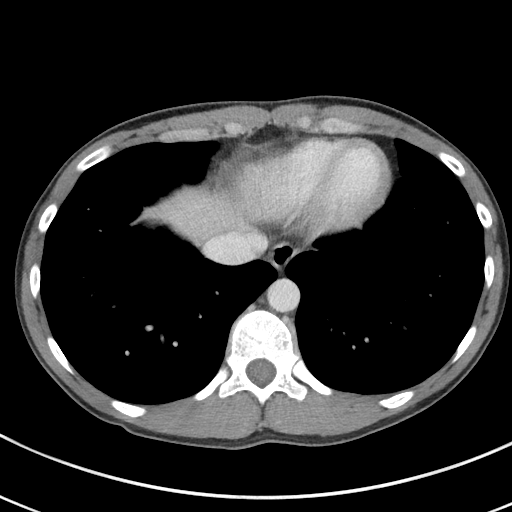

[Series 5: coronal st · coronal · 0.65mm/px · 3 of 75 slices shown]
[im 25/75  soft-tissue]
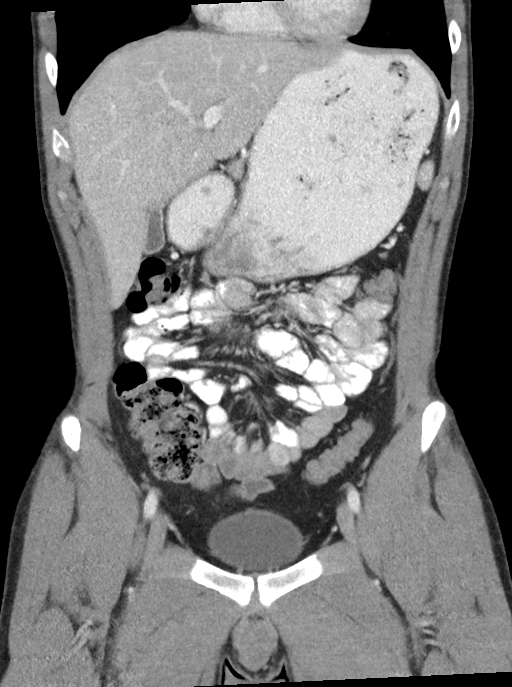
[im 33/75  soft-tissue]
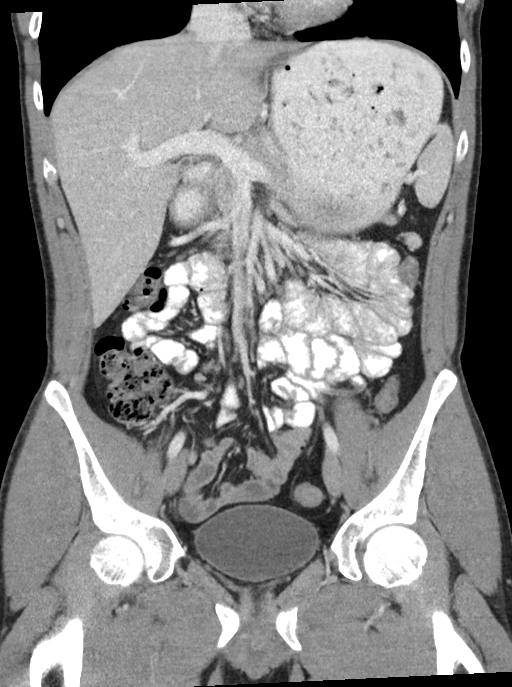
[im 42/75  soft-tissue]
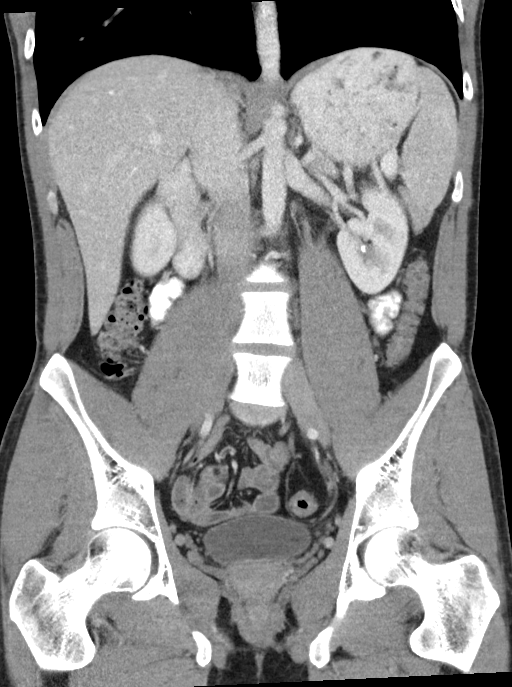

[16 of 46 positions shown; findings below may reference images not displayed]

FINDINGS: Lower chest: No acute abnormality.

Hepatobiliary: No focal liver abnormality is seen. No gallstones,
gallbladder wall thickening, or biliary dilatation.

Pancreas: Unremarkable. No pancreatic ductal dilatation or
surrounding inflammatory changes.

Spleen: Normal in size without focal abnormality.

Adrenals/Urinary Tract: Adrenal glands are unremarkable. There is no
hydronephrosis bilaterally. There is a 2 mm nonobstructing stone in
the lower pole left kidney. No focal renal lesion is identified. The
bladder is normal.

Stomach/Bowel: Stomach is within normal limits. Appendix appears
normal. No evidence of bowel wall thickening, distention, or
inflammatory changes.

Vascular/Lymphatic: No significant vascular findings are present. No
enlarged abdominal or pelvic lymph nodes.

Reproductive: Prostate is unremarkable.

Other: None.

Musculoskeletal: No acute or significant osseous findings.
IMPRESSION: No acute abnormality identified in the abdomen and pelvis.

The appendix is normal.

Nonobstructing stone in the left kidney.

## 2022-04-04 NOTE — Telephone Encounter (Signed)
[place]    Received call from [facility] at [facility] with [employer].    Subjective: Caller states "Chest and back pain"   Needs to schedule as a new patient - Sports Medicine and Primary Care    Current Symptoms: Cramp near the center and slightly to the left, lasted 20 seconds. This week more of a constant pain with some SOB at the same time. Noticing more over the last week. Raising left arm causes some pain in the armpit. Also pain on the left side of the upper back.  Chest area is tender when touching.  Goes away when laying down  Noticed a slight flutter this past week    Onset: 2 weeks ago    Pain Severity: 3/10, when at its worst, ache, comes and goes but is now lasting longer. First time it was felt it was sudden and sharp    Temperature: Patient is reported to not have a fever. Also denies chills and sweats     What has been tried: Ibuprofen, increased fluid    History related to the current reason for call: None known    Recommended disposition: Go to ED/UCC Now (Or to Office with PCP Approval)    Care advice provided, patient verbalizes understanding; denies any other questions or concerns; instructed to call back for any new or worsening symptoms.    Patient/Caller agrees with recommended disposition; Probation officer provided warm transfer to [employer] at [employer] for appointment scheduling     Attention Provider:  Thank you for allowing me to participate in the care of your patient.  The patient was connected to triage in response to information provided to the ECC/PSC.  Please do not respond through this encounter as the response is not directed to a shared pool.    Reason for Disposition   Chest pain or 'angina' comes and goes and is happening more often (increasing in frequency) or getting worse (increasing in severity) (Exception: Chest pains that last only a few seconds.)    Protocols used: Chest Pain-ADULT-[place]

## 2022-05-08 ENCOUNTER — Ambulatory Visit: Admit: 2022-05-08 | Discharge: 2022-05-08 | Payer: BLUE CROSS/BLUE SHIELD | Attending: Family Medicine

## 2022-05-08 ENCOUNTER — Ambulatory Visit: Admit: 2022-05-08 | Discharge: 2022-05-08 | Payer: BLUE CROSS/BLUE SHIELD

## 2022-05-08 DIAGNOSIS — M940 Chondrocostal junction syndrome [Tietze]: Secondary | ICD-10-CM

## 2022-05-08 MED ORDER — NAPROXEN 500 MG PO TABS
500 | ORAL_TABLET | Freq: Two times a day (BID) | ORAL | 0 refills | Status: AC
Start: 2022-05-08 — End: ?

## 2022-05-08 NOTE — Progress Notes (Signed)
Rm    Chief Complaint   Patient presents with    New Patient     Costochondritis        BP 105/70 (Site: Left Upper Arm)   Pulse 77   Temp 98 F (36.7 C)   Resp 20   Ht 1.803 m (5\' 11" )   Wt 74.8 kg (165 lb)   SpO2 98%   BMI 23.01 kg/m      1. Have you been to the ER, urgent care clinic since your last visit?  Hospitalized since your last visit? no     2. Have you seen or consulted any other health care providers outside of the [facility] since your last visit?  Include any pap smears or colon screening. no    Health Maintenance Due   Topic Date Due    Hepatitis B vaccine (1 of 3 - 3-dose series) Never done    COVID-19 Vaccine (1) Never done    Varicella vaccine (1 of 2 - 2-dose childhood series) Never done    HPV vaccine (1 - Male 2-dose series) Never done    Depression Screen  Never done    HIV screen  Never done    Hepatitis C screen  Never done    DTaP/Tdap/Td vaccine (1 - Tdap) Never done    Flu vaccine (1) Never done             No data to display                 Failed to redirect to the Timeline version of the REVFS SmartLink.    Failed to redirect to the Timeline version of the REVFS SmartLink.     "Have you been to the ER, urgent care clinic since your last visit?  Hospitalized since your last visit?"    no    "Have you seen or consulted any other health care providers outside of [place] since your last visit?"    no            Click Here for Release of Records Request

## 2022-05-08 NOTE — Progress Notes (Signed)
[facility]  [address] [place] [address]  Phone: [phone]  Fax: [phone]        Chief Complaint   Patient presents with    New Patient     Costochondritis     He is a 27 y.o. male who presents for establish care.  Says that he went to urgent care ~1 month ago for chest and back pain on the left side.  He underwent work up and was told he had costochondritis.  He was treated with naproxen.  Says that this helped.  Forgot about it.  Says that the pain started slowly coming back 2 weeks ago following a cold and intense coughing.  Says that it is slowly getting to the point it was where he went to UC.    No known personal or family cardiac history.    Has not seen a doctor in a few years.  He is from [place], [place].  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    Prior to Visit Medications    Medication Sig Taking? Authorizing Provider   naproxen (NAPROSYN) 500 MG tablet Take 1 tablet by mouth 2 times daily (with meals) Yes Michaela Corner, MD       Allergies   Allergen Reactions    Penicillins Hives         Reviewed PmHx, RxHx, FmHx, SocHx, AllgHx and updated and dated in the chart.      Objective:     Vitals:    05/08/22 1033   BP: 105/70   Site: Left Upper Arm   Pulse: 77   Resp: 20   Temp: 98 F (36.7 C)   SpO2: 98%   Weight: 74.8 kg (165 lb)   Height: 1.803 m (5\' 11" )     Physical Examination:    Physical Exam  Vitals and nursing note reviewed.   Constitutional:       General: He is not in acute distress.     Appearance: Normal appearance.   HENT:      Head: Normocephalic and atraumatic.      Right Ear: Tympanic membrane, ear canal and external ear normal.      Left Ear: Tympanic membrane, ear canal and external ear normal.      Nose: Nose normal.      Mouth/Throat:      Mouth: Mucous membranes are moist.      Pharynx: No oropharyngeal exudate.   Eyes:      Extraocular Movements: Extraocular movements intact.      Pupils: Pupils are equal, round, and reactive to light.    Cardiovascular:      Rate and Rhythm: Normal rate and regular rhythm.      Pulses: Normal pulses.      Heart sounds: Normal heart sounds.   Pulmonary:      Effort: Pulmonary effort is normal.      Breath sounds: Normal breath sounds.   Chest:      Chest wall: Tenderness (left chest wall) present.   Abdominal:      General: Abdomen is flat. Bowel sounds are normal. There is no distension.      Palpations: Abdomen is soft.      Tenderness: There is no abdominal tenderness.   Musculoskeletal:         General: Normal range of motion.      Cervical back: Normal range of motion.   Lymphadenopathy:      Cervical: No cervical  adenopathy.   Skin:     General: Skin is warm and dry.      Findings: No rash.   Neurological:      General: No focal deficit present.      Mental Status: He is alert and oriented to person, place, and time.      Cranial Nerves: No cranial nerve deficit.   Psychiatric:         Mood and Affect: Mood normal.         Behavior: Behavior normal.         No results found for this visit on 05/08/22.    Assessment/ Plan:   Seton was seen today for new patient.    Diagnoses and all orders for this visit:    Costochondritis  -     Lipid Panel; Future  -     Comprehensive Metabolic Panel; Future  -     CBC with Auto Differential; Future  -     naproxen (NAPROSYN) 500 MG tablet; Take 1 tablet by mouth 2 times daily (with meals)    Screening for HIV (human immunodeficiency virus)  -     HIV 1/2 Ag/Ab, 4TH Generation,W Rflx Confirm; Future    Encounter for HCV screening test for low risk patient  -     Hepatitis C Antibody; Future    Encounter for immunization  -     Tdap, [place], (age 44 yrs+), IM       Will check some labs.  Suspect costochondritis based on history.  He is low cardiac risk.  He will let me know if this persists.  Follow up yearly for physical.    Return in about 1 year (around 05/08/2023) for [facility] + fasting labs.     I have discussed the diagnosis with the patient and the intended treatment  plan as seen in the above orders. The patient has received an after-visit summary and questions were answered concerning future plans. Asked to return should symptoms worsen or not improve with treatment. Any pending labs and studies will be relayed to patient when they become available.     Pt verbalizes understanding of plan of care and denies further questions or concerns at this time.     Suezanne Jacquet, MD, FAAFP

## 2022-05-09 LAB — LIPID PANEL
Chol/HDL Ratio: 4.7 (ref 0.0–5.0)
Cholesterol, Total: 183 MG/DL (ref <200)
HDL: 39 MG/DL
LDL Calculated: 110.2 MG/DL — ABNORMAL HIGH (ref 0–100)
Triglycerides: 169 MG/DL — ABNORMAL HIGH (ref <150)
VLDL Cholesterol Calculated: 33.8 MG/DL

## 2022-05-09 LAB — CBC WITH AUTO DIFFERENTIAL
Absolute Immature Granulocyte: 0 10*3/uL (ref 0.00–0.04)
Basophils %: 1 % (ref 0–1)
Basophils Absolute: 0 10*3/uL (ref 0.0–0.1)
Eosinophils %: 2 % (ref 0–7)
Eosinophils Absolute: 0.1 10*3/uL (ref 0.0–0.4)
Hematocrit: 45.1 % (ref 36.6–50.3)
Hemoglobin: 15.8 g/dL (ref 12.1–17.0)
Immature Granulocytes: 0 % (ref 0.0–0.5)
Lymphocytes %: 40 % (ref 12–49)
Lymphocytes Absolute: 1.6 10*3/uL (ref 0.8–3.5)
MCH: 33.5 PG (ref 26.0–34.0)
MCHC: 35 g/dL (ref 30.0–36.5)
MCV: 95.8 FL (ref 80.0–99.0)
MPV: 10 FL (ref 8.9–12.9)
Monocytes %: 7 % (ref 5–13)
Monocytes Absolute: 0.3 10*3/uL (ref 0.0–1.0)
Neutrophils %: 50 % (ref 32–75)
Neutrophils Absolute: 2.1 10*3/uL (ref 1.8–8.0)
Nucleated RBCs: 0 PER 100 WBC
Platelets: 246 10*3/uL (ref 150–400)
RBC: 4.71 M/uL (ref 4.10–5.70)
RDW: 12.4 % (ref 11.5–14.5)
WBC: 4 10*3/uL — ABNORMAL LOW (ref 4.1–11.1)
nRBC: 0 10*3/uL (ref 0.00–0.01)

## 2022-05-09 LAB — COMPREHENSIVE METABOLIC PANEL
ALT: 72 U/L (ref 12–78)
AST: 39 U/L — ABNORMAL HIGH (ref 15–37)
Albumin/Globulin Ratio: 1.6 (ref 1.1–2.2)
Albumin: 4.6 g/dL (ref 3.5–5.0)
Alk Phosphatase: 82 U/L (ref 45–117)
Anion Gap: 3 mmol/L — ABNORMAL LOW (ref 5–15)
BUN: 8 MG/DL (ref 6–20)
Bun/Cre Ratio: 7 — ABNORMAL LOW (ref 12–20)
CO2: 30 mmol/L (ref 21–32)
Calcium: 9.2 MG/DL (ref 8.5–10.1)
Chloride: 108 mmol/L (ref 97–108)
Creatinine: 1.07 MG/DL (ref 0.70–1.30)
Est, Glom Filt Rate: >90 mL/min/{1.73_m2} (ref >60)
Globulin: 2.9 g/dL (ref 2.0–4.0)
Glucose: 92 mg/dL (ref 65–100)
Potassium: 4.7 mmol/L (ref 3.5–5.1)
Sodium: 141 mmol/L (ref 136–145)
Total Bilirubin: 0.8 MG/DL (ref 0.2–1.0)
Total Protein: 7.5 g/dL (ref 6.4–8.2)

## 2022-05-09 LAB — HIV 1/2 AG/AB, 4TH GENERATION,W RFLX CONFIRM: HIV 1/2 Interp: NONREACTIVE

## 2022-05-09 LAB — HEPATITIS C ANTIBODY
Hep C Ab Interp: NONREACTIVE
Hepatitis C Ab: <0.02 Index

## 2022-05-12 NOTE — Other (Signed)
Your physician noted that your LIPID panel was abnormal, she is concerned with how this affects your cardiovascular disease score.Advise: reduce intake of fried fatty, start a fiber supplement, increase water intake to 64 ounces. 30-60 minutes of aerobic exercise. Look up the following diets for examples of healthy eating habits, the mediterranean diet, and the whole 30.

## 2022-05-13 NOTE — Telephone Encounter (Signed)
-----   Message from Sharla Kidney, MD sent at 05/12/2022  8:35 PM EDT -----  Your physician noted that your LIPID panel was abnormal, she is concerned with how this affects your cardiovascular disease score.Advise: reduce intake of fried fatty, start a fiber supplement, increase water intake to 64 ounces. 30-60 minutes of aerobic exercise. Look up the following diets for examples of healthy eating habits, the mediterranean diet, and the whole 30.

## 2022-05-13 NOTE — Telephone Encounter (Signed)
Labs reviewed with patient and they verbalized understanding   Pt was not fasting for these labs
